# Patient Record
Sex: Female | Born: 1995 | Hispanic: Yes | Marital: Single | Smoking: Never smoker
Health system: Southern US, Community
[De-identification: ages and names within clinical notes are randomized; demographics above are authoritative.]

## PROBLEM LIST (undated history)

## (undated) ENCOUNTER — Inpatient Hospital Stay (HOSPITAL_COMMUNITY): Payer: Self-pay

## (undated) DIAGNOSIS — Z3400 Encounter for supervision of normal first pregnancy, unspecified trimester: Secondary | ICD-10-CM

## (undated) DIAGNOSIS — O020 Blighted ovum and nonhydatidiform mole: Secondary | ICD-10-CM

## (undated) HISTORY — PX: NO PAST SURGERIES: SHX2092

## (undated) HISTORY — DX: Encounter for supervision of normal first pregnancy, unspecified trimester: Z34.00

## (undated) HISTORY — DX: Blighted ovum and nonhydatidiform mole: O02.0

---
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
## 2015-01-10 ENCOUNTER — Inpatient Hospital Stay (HOSPITAL_COMMUNITY)
Admission: AD | Admit: 2015-01-10 | Discharge: 2015-01-10 | Disposition: A | Discharge status: Home / Self Care | Payer: Medicaid Other | Source: Ambulatory Visit | Attending: Obstetrics and Gynecology | Admitting: Obstetrics and Gynecology

## 2015-01-10 ENCOUNTER — Inpatient Hospital Stay (HOSPITAL_COMMUNITY): Payer: Medicaid Other

## 2015-01-10 DIAGNOSIS — Z3A08 8 weeks gestation of pregnancy: Secondary | ICD-10-CM | POA: Insufficient documentation

## 2015-01-10 DIAGNOSIS — O208 Other hemorrhage in early pregnancy: Secondary | ICD-10-CM | POA: Insufficient documentation

## 2015-01-10 DIAGNOSIS — O9989 Other specified diseases and conditions complicating pregnancy, childbirth and the puerperium: Secondary | ICD-10-CM | POA: Diagnosis present

## 2015-01-10 DIAGNOSIS — Z09 Encounter for follow-up examination after completed treatment for conditions other than malignant neoplasm: Secondary | ICD-10-CM

## 2015-01-10 NOTE — MAU Provider Note (Signed)
Subjective:  Sheila Sosa is a 19 y.o. female G3P1011 at 6468w0d who presents for a follow up appointment. She was seen here in MAU on 5/24 with lower abdominal pain; US showed a probable IUP and probable yolk sac. 48 hour quant did not rise appropriately and the patient was instructed to return for an additional quant, which the patient failed to return for. Currently she denies pain or vaginal bleeding.     Objective:  GENERAL: Well-developed, well-nourished female in no acute distress.  LUNGS: Effort normal SKIN: Warm, dry and without erythema PSYCH: Normal mood and affect  Filed Vitals:   01/10/15 0945  BP: 122/59  Pulse: 78  Temp: 98.2 F (36.8 C)  Resp: 18    [place] Ob Transvaginal  01/10/2015   CLINICAL DATA:  Assess viability  EXAM: TRANSVAGINAL OB ULTRASOUND  TECHNIQUE: Transvaginal ultrasound was performed for complete evaluation of the gestation as well as the maternal uterus, adnexal regions, and pelvic cul-de-sac.  COMPARISON:  None.  FINDINGS: Intrauterine gestational sac: Single  Yolk sac:  Yes  Embryo:  Yes  Cardiac Activity: Yes  Heart Rate: 144 bpm  CRL:   16.8  mm   8 w 1 d                  US EDC: 08/21/2015  Maternal uterus/adnexae:  Subchorionic hemorrhage: Moderate subchorionic hemorrhage measuring 2.4 x 1.2 x 2.6 cm noted.  Right ovary: Not visualize  Left ovary: Normal  Other :None  Free fluid:  None  IMPRESSION: 1. Single living intrauterine gestation. The estimated gestational age is 8 weeks and 1 day. The clinical gestational age is 7 weeks and 0 days 2. Moderate subchorionic hemorrhage.   Electronically Signed   By: Signa Kellaylor  Stroud M.D.   On: 01/10/2015 10:26    Assesment:  SIUP @ 3363w1d Moderate subchorionic hemorrhage.    Plan:  Discharge home in stable condition Start prenatal care ASAP First trimester warning signs discussed Return to MAU as needed, if symptoms worsen.     Duane LopeJennifer I Rogerio Boutelle, NP 01/10/2015 10:42 AM

## 2015-03-27 ENCOUNTER — Inpatient Hospital Stay (HOSPITAL_COMMUNITY)
Admission: AD | Admit: 2015-03-27 | Discharge: 2015-03-27 | Discharge status: Against Medical Advice | Payer: Medicaid Other | Source: Ambulatory Visit | Attending: Obstetrics & Gynecology | Admitting: Obstetrics & Gynecology

## 2015-03-27 DIAGNOSIS — Z5320 Procedure and treatment not carried out because of patient's decision for unspecified reasons: Secondary | ICD-10-CM | POA: Insufficient documentation

## 2015-03-27 DIAGNOSIS — R109 Unspecified abdominal pain: Secondary | ICD-10-CM | POA: Diagnosis present

## 2015-03-27 LAB — URINE MICROSCOPIC-ADD ON

## 2015-03-27 LAB — URINALYSIS, ROUTINE W REFLEX MICROSCOPIC
GLUCOSE, UA: NEGATIVE mg/dL
HGB URINE DIPSTICK: NEGATIVE
KETONES UR: NEGATIVE mg/dL
Nitrite: NEGATIVE
PROTEIN: NEGATIVE mg/dL
Specific Gravity, Urine: 1.025 (ref 1.005–1.030)
UROBILINOGEN UA: 2 mg/dL — AB (ref 0.0–1.0)
pH: 6.5 (ref 5.0–8.0)

## 2015-03-27 NOTE — MAU Note (Signed)
Pt left, did not inform staff

## 2015-03-27 NOTE — MAU Note (Signed)
Stomach pain since beginning of pregnancy. Has continued and now hurts down into vagina. Started having white D/C. Went to one prenatal appointment in [place] and now lives in [place] again. Thinks she will go to the clinic here.

## 2015-04-11 ENCOUNTER — Ambulatory Visit (INDEPENDENT_AMBULATORY_CARE_PROVIDER_SITE_OTHER): Payer: Self-pay | Admitting: Obstetrics and Gynecology

## 2015-04-11 ENCOUNTER — Encounter: Payer: Self-pay | Admitting: Obstetrics and Gynecology

## 2015-04-11 VITALS — BP 119/64 | HR 86 | Temp 98.6°F | Wt 197.9 lb

## 2015-04-11 DIAGNOSIS — Z118 Encounter for screening for other infectious and parasitic diseases: Secondary | ICD-10-CM

## 2015-04-11 DIAGNOSIS — Z124 Encounter for screening for malignant neoplasm of cervix: Secondary | ICD-10-CM

## 2015-04-11 DIAGNOSIS — O418X90 Other specified disorders of amniotic fluid and membranes, unspecified trimester, not applicable or unspecified: Secondary | ICD-10-CM | POA: Insufficient documentation

## 2015-04-11 DIAGNOSIS — Z3490 Encounter for supervision of normal pregnancy, unspecified, unspecified trimester: Secondary | ICD-10-CM | POA: Insufficient documentation

## 2015-04-11 DIAGNOSIS — Z113 Encounter for screening for infections with a predominantly sexual mode of transmission: Secondary | ICD-10-CM

## 2015-04-11 DIAGNOSIS — Z23 Encounter for immunization: Secondary | ICD-10-CM

## 2015-04-11 DIAGNOSIS — Z3492 Encounter for supervision of normal pregnancy, unspecified, second trimester: Secondary | ICD-10-CM

## 2015-04-11 DIAGNOSIS — O0932 Supervision of pregnancy with insufficient antenatal care, second trimester: Secondary | ICD-10-CM

## 2015-04-11 DIAGNOSIS — O468X9 Other antepartum hemorrhage, unspecified trimester: Principal | ICD-10-CM

## 2015-04-11 LAB — POCT URINALYSIS DIP (DEVICE)
Glucose, UA: 100 mg/dL — AB
Nitrite: NEGATIVE
PH: 6.5 (ref 5.0–8.0)
Protein, ur: NEGATIVE mg/dL
SPECIFIC GRAVITY, URINE: 1.025 (ref 1.005–1.030)
Urobilinogen, UA: 1 mg/dL (ref 0.0–1.0)

## 2015-04-11 LAB — GLUCOSE TOLERANCE, 1 HOUR (50G) W/O FASTING: GLUCOSE 1 HOUR GTT: 82 mg/dL (ref 70–140)

## 2015-04-11 NOTE — Progress Notes (Signed)
Subjective:  RHONDA LINAN is a 19 y.o. G3P1011 at [redacted]w[redacted]d being seen today for initial prenatal care.  Patient reports no complaints.  Contractions: Irritability.  Vag. Bleeding: None. Movement: Present. Denies leaking of fluid.   The following portions of the patient's history were reviewed and updated as appropriate: allergies, current medications, past family history, past medical history, past social history, past surgical history and problem list.   Objective:   Filed Vitals:   04/11/15 0840  BP: 119/64  Pulse: 86  Temp: 98.6 F (37 C)  Weight: 197 lb 14.4 oz (89.767 kg)    Fetal Status: Fetal Heart Rate (bpm): 150 Fundal Height: 19 cm Movement: Present     General:  Alert, oriented and cooperative. Patient is in no acute distress.  Skin: Skin is warm and dry. No rash noted.   Cardiovascular: Normal heart rate noted  Respiratory: Normal respiratory effort, no problems with respiration noted  Abdomen: Soft, gravid, appropriate for gestational age. Pain/Pressure: Absent     Pelvic: Vag. Bleeding: None     Cervical exam deferred        Extremities: Normal range of motion.  Edema: None  Mental Status: Normal mood and affect. Normal behavior. Normal judgment and thought content.   Urinalysis: Urine Protein: Negative Urine Glucose: 1+  Assessment and Plan:  Pregnancy: G3P1011 at [redacted]w[redacted]d  1. Subchorionic hemorrhage, unspecified trimester - expectant mgmt  2. Supervision of low-risk pregnancy, unspecified trimester - continue PNV - anatomy ultrasound, scheduling - prenatal panel, urine culture, vaginal gc/chlamydia, UDS - 1-hour GTT (fam hx DM) - declines quad  4. Flu vaccine need - Flu Vaccine QUAD 36+ mos IM; Standing - Flu Vaccine QUAD 36+ mos IM  Preterm labor symptoms and general obstetric precautions including but not limited to vaginal bleeding, contractions, leaking of fluid and fetal movement were reviewed in detail with the patient. Please refer to After  Visit Summary for other counseling recommendations.  Return in about 4 weeks (around 05/09/2015).   Kathrynn Running, MD

## 2015-04-11 NOTE — Progress Notes (Signed)
Initial prenatal visits Flu vaccine 1 hour gtt New OB educational packet given Breastfeeding tip of the week reviewed Home Medicaid form completed Bilirubin:small, Ketones:trace,hgb: trace, leukocytes: small

## 2015-04-12 LAB — GC/CHLAMYDIA PROBE AMP (~~LOC~~) NOT AT ARMC
Chlamydia: NEGATIVE
Neisseria Gonorrhea: NEGATIVE

## 2015-04-13 LAB — PRENATAL PROFILE (SOLSTAS)
Antibody Screen: NEGATIVE
Basophils Absolute: 0 10*3/uL (ref 0.0–0.1)
Basophils Relative: 0 % (ref 0–1)
Eosinophils Absolute: 0.1 10*3/uL (ref 0.0–0.7)
Eosinophils Relative: 2 % (ref 0–5)
HCT: 33.2 % — ABNORMAL LOW (ref 36.0–46.0)
HEP B S AG: NEGATIVE
HIV: NONREACTIVE
Hemoglobin: 11.2 g/dL — ABNORMAL LOW (ref 12.0–15.0)
LYMPHS ABS: 1.5 10*3/uL (ref 0.7–4.0)
LYMPHS PCT: 22 % (ref 12–46)
MCH: 31.3 pg (ref 26.0–34.0)
MCHC: 33.7 g/dL (ref 30.0–36.0)
MCV: 92.7 fL (ref 78.0–100.0)
MONO ABS: 0.4 10*3/uL (ref 0.1–1.0)
MPV: 9.5 fL (ref 8.6–12.4)
Monocytes Relative: 6 % (ref 3–12)
Neutro Abs: 4.6 10*3/uL (ref 1.7–7.7)
Neutrophils Relative %: 70 % (ref 43–77)
Platelets: 248 10*3/uL (ref 150–400)
RBC: 3.58 MIL/uL — ABNORMAL LOW (ref 3.87–5.11)
RDW: 14.2 % (ref 11.5–15.5)
Rh Type: POSITIVE
Rubella: 2.42 Index — ABNORMAL HIGH (ref <0.90)
WBC: 6.6 10*3/uL (ref 4.0–10.5)

## 2015-04-13 LAB — PRESCRIPTION MONITORING PROFILE (19 PANEL)
AMPHETAMINE/METH: NEGATIVE ng/mL
BARBITURATE SCREEN, URINE: NEGATIVE ng/mL
Benzodiazepine Screen, Urine: NEGATIVE ng/mL
Buprenorphine, Urine: NEGATIVE ng/mL
CANNABINOID SCRN UR: NEGATIVE ng/mL
CARISOPRODOL, URINE: NEGATIVE ng/mL
Cocaine Metabolites: NEGATIVE ng/mL
Creatinine, Urine: 296.63 mg/dL (ref >20.0)
Fentanyl, Ur: NEGATIVE ng/mL
MDMA URINE: NEGATIVE ng/mL
MEPERIDINE UR: NEGATIVE ng/mL
Methadone Screen, Urine: NEGATIVE ng/mL
Methaqualone: NEGATIVE ng/mL
NITRITES URINE, INITIAL: NEGATIVE ug/mL
OPIATE SCREEN, URINE: NEGATIVE ng/mL
OXYCODONE SCRN UR: NEGATIVE ng/mL
PHENCYCLIDINE, UR: NEGATIVE ng/mL
PROPOXYPHENE: NEGATIVE ng/mL
TAPENTADOLUR: NEGATIVE ng/mL
Tramadol Scrn, Ur: NEGATIVE ng/mL
Zolpidem, Urine: NEGATIVE ng/mL
pH, Initial: 5.8 pH (ref 4.5–8.9)

## 2015-04-13 LAB — CULTURE, OB URINE

## 2015-04-13 LAB — HEMOGLOBINOPATHY EVALUATION
HGB A2 QUANT: 2.7 % (ref 2.2–3.2)
Hemoglobin Other: 0 %
Hgb A: 96.5 % — ABNORMAL LOW (ref 96.8–97.8)
Hgb F Quant: 0.8 % (ref 0.0–2.0)
Hgb S Quant: 0 %

## 2015-04-17 ENCOUNTER — Ambulatory Visit (HOSPITAL_COMMUNITY)
Admission: RE | Admit: 2015-04-17 | Discharge: 2015-04-17 | Disposition: A | Discharge status: Home / Self Care | Payer: Medicaid Other | Source: Ambulatory Visit | Attending: Obstetrics and Gynecology | Admitting: Obstetrics and Gynecology

## 2015-04-17 DIAGNOSIS — O0932 Supervision of pregnancy with insufficient antenatal care, second trimester: Secondary | ICD-10-CM | POA: Diagnosis present

## 2015-05-09 ENCOUNTER — Ambulatory Visit (INDEPENDENT_AMBULATORY_CARE_PROVIDER_SITE_OTHER): Payer: Self-pay | Admitting: Advanced Practice Midwife

## 2015-05-09 VITALS — BP 105/63 | HR 86 | Temp 98.2°F | Wt 196.0 lb

## 2015-05-09 DIAGNOSIS — O2610 Low weight gain in pregnancy, unspecified trimester: Secondary | ICD-10-CM | POA: Insufficient documentation

## 2015-05-09 DIAGNOSIS — O358XX0 Maternal care for other (suspected) fetal abnormality and damage, not applicable or unspecified: Secondary | ICD-10-CM | POA: Insufficient documentation

## 2015-05-09 DIAGNOSIS — O2612 Low weight gain in pregnancy, second trimester: Secondary | ICD-10-CM

## 2015-05-09 DIAGNOSIS — O35EXX0 Maternal care for other (suspected) fetal abnormality and damage, fetal genitourinary anomalies, not applicable or unspecified: Secondary | ICD-10-CM

## 2015-05-09 DIAGNOSIS — Z3492 Encounter for supervision of normal pregnancy, unspecified, second trimester: Secondary | ICD-10-CM

## 2015-05-09 LAB — POCT URINALYSIS DIP (DEVICE)
Glucose, UA: NEGATIVE mg/dL
NITRITE: NEGATIVE
PH: 6 (ref 5.0–8.0)
PROTEIN: 30 mg/dL — AB
Specific Gravity, Urine: >=1.03 (ref 1.005–1.030)
Urobilinogen, UA: 1 mg/dL (ref 0.0–1.0)

## 2015-05-09 NOTE — Progress Notes (Signed)
Subjective:  Sheila Sosa is a 19 y.o. G3P1011 at [redacted]w[redacted]d being seen today for ongoing prenatal care.  Patient reports no complaints. No N/V, but less appetite throughout pregnancy.  Contractions: Not present.  Vag. Bleeding: None. Movement: Present. Denies leaking of fluid.   Anatomy scan showed fetal pyelectasis. EFW 48%.  The following portions of the patient's history were reviewed and updated as appropriate: allergies, current medications, past family history, past medical history, past social history, past surgical history and problem list.   Objective:   Filed Vitals:   05/09/15 1052  BP: 105/63  Pulse: 86  Temp: 98.2 F (36.8 C)  Weight: 196 lb (88.905 kg)    Fetal Status: Fetal Heart Rate (bpm): 140   Movement: Present     General:  Alert, oriented and cooperative. Patient is in no acute distress.  Skin: Skin is warm and dry. No rash noted.   Cardiovascular: Normal heart rate noted  Respiratory: Normal respiratory effort, no problems with respiration noted  Abdomen: Soft, gravid, appropriate for gestational age. Pain/Pressure: Absent     Pelvic: Vag. Bleeding: None     Cervical exam deferred        Extremities: Normal range of motion.  Edema: Trace  Mental Status: Normal mood and affect. Normal behavior. Normal judgment and thought content.   Urinalysis: Urine Protein: 1+ Urine Glucose: Negative  Assessment and Plan:  Pregnancy: G3P1011 at [redacted]w[redacted]d  1. Supervision of low-risk pregnancy, second trimester  - [place] MFM OB FOLLOW UP; Future  2. Poor weight gain of pregnancy, second trimester  - [place] MFM OB FOLLOW UP; Future  3. Ultrasound recheck of fetal pyelectasis, antepartum, not applicable or unspecified fetus  - [place] MFM OB FOLLOW UP; Future  Preterm labor symptoms and general obstetric precautions including but not limited to vaginal bleeding, contractions, leaking of fluid and fetal movement were reviewed in detail with the patient. Please refer to After Visit  Summary for other counseling recommendations.  Discussed having small, frequent meals.  Return in about 4 weeks (around 06/06/2015) for ROB/GTT.   [name], CNM

## 2015-05-09 NOTE — Patient Instructions (Signed)
Glucose Tolerance Test During Pregnancy The glucose tolerance test (GTT) is a blood test used to determine if you have developed a type of diabetes during pregnancy (gestational diabetes). This is when your body does not properly process sugar (glucose) in the food you eat, resulting in high blood glucose levels. Typically, a GTT is done after you have had a 1-hour glucose test with results that indicate you possibly have gestational diabetes. It may also be done if:  You have a history of giving birth to very large babies or have experienced repeated fetal loss (stillbirth).   You have signs and symptoms of diabetes, such as:   Changes in your vision.   Tingling or numbness in your hands or feet.   Changes in hunger, thirst, and urination not otherwise explained by your pregnancy.  The GTT lasts about 3 hours. You will be given a sugar-water solution to drink at the beginning of the test. You will have blood drawn before you drink the solution and then again 1, 2, and 3 hours after you drink it. You will not be allowed to eat or drink anything else during the test. You must remain at the testing location to make sure that your blood is drawn on time. You should also avoid exercising during the test, because exercise can alter test results. PREPARATION FOR TEST  Eat normally for 3 days prior to the GTT test, including having plenty of carbohydrate-rich foods. Do not eat or drink anything except water during the final 12 hours before the test. In addition, your health care provider may ask you to stop taking certain medicines before the test. RESULTS  It is your responsibility to obtain your test results. Ask the lab or department performing the test when and how you will get your results. Contact your health care provider to discuss any questions you have about your results.  Range of Normal Values Ranges for normal values may vary among different labs and hospitals. You should always check  with your health care provider after having lab work or other tests done to discuss whether your values are considered within normal limits. Normal levels of blood glucose are as follows:  Fasting: less than 105 mg/dL.   1 hour after drinking the solution: less than 190 mg/dL.   2 hours after drinking the solution: less than 165 mg/dL.   3 hours after drinking the solution: less than 145 mg/dL.  Some substances can interfere with GTT results. These may include:  Blood pressure and heart failure medicines, including beta blockers, furosemide, and thiazides.   Anti-inflammatory medicines, including aspirin.   Nicotine.   Some psychiatric medicines.  Meaning of Results Outside Normal Value Ranges GTT test results that are above normal values may indicate a number of health problems, such as:   Gestational diabetes.   Acute stress response.   Cushing syndrome.   Tumors such as pheochromocytoma or glucagonoma.   Long-term kidney problems.   Pancreatitis.   Hyperthyroidism.   Current infection.  Discuss your test results with your health care provider. He or she will use the results to make a diagnosis and determine a treatment plan that is right for you.   This information is not intended to replace advice given to you by your health care provider. Make sure you discuss any questions you have with your health care provider.   Document Released: 01/20/2012 Document Revised: 08/11/2014 Document Reviewed: 11/25/2013 Elsevier Interactive Patient Education 2016 [employer].   Tdap Vaccine (Tetanus,  Diphtheria and Pertussis): What You Need to Know 1. Why get vaccinated? Tetanus, diphtheria and pertussis are very serious diseases. Tdap vaccine can protect [place] from these diseases. And, Tdap vaccine given to pregnant women can protect newborn babies against pertussis. TETANUS (Lockjaw) is rare in the [place] today. It causes painful muscle tightening and  stiffness, usually all over the body.  It can lead to tightening of muscles in the head and neck so you can't open your mouth, swallow, or sometimes even breathe. Tetanus kills about 1 out of 10 people who are infected even after receiving the best medical care. DIPHTHERIA is also rare in the [place] today. It can cause a thick coating to form in the back of the throat.  It can lead to breathing problems, heart failure, paralysis, and death. PERTUSSIS (Whooping Cough) causes severe coughing spells, which can cause difficulty breathing, vomiting and disturbed sleep.  It can also lead to weight loss, incontinence, and rib fractures. Up to 2 in 100 adolescents and 5 in 100 adults with pertussis are hospitalized or have complications, which could include pneumonia or death. These diseases are caused by bacteria. Diphtheria and pertussis are spread from person to person through secretions from coughing or sneezing. Tetanus enters the body through cuts, scratches, or wounds. Before vaccines, as many as 200,000 cases of diphtheria, 200,000 cases of pertussis, and hundreds of cases of tetanus, were reported in the [place] each year. Since vaccination began, reports of cases for tetanus and diphtheria have dropped by about 99% and for pertussis by about 80%. 2. Tdap vaccine Tdap vaccine can protect adolescents and adults from tetanus, diphtheria, and pertussis. One dose of Tdap is routinely given at age 92 or 48. People who did not get Tdap at that age should get it as soon as possible. Tdap is especially important for healthcare professionals and anyone having close contact with a baby younger than 12 months. Pregnant women should get a dose of Tdap during every pregnancy, to protect the newborn from pertussis. Infants are most at risk for severe, life-threatening complications from pertussis. Another vaccine, called Td, protects against tetanus and diphtheria, but not pertussis. A Td booster  should be given every 10 years. Tdap may be given as one of these boosters if you have never gotten Tdap before. Tdap may also be given after a severe cut or burn to prevent tetanus infection. Your doctor or the person giving you the vaccine can give you more information. Tdap may safely be given at the same time as other vaccines. 3. Some people should not get this vaccine  A person who has ever had a life-threatening allergic reaction after a previous dose of any diphtheria, tetanus or pertussis containing vaccine, OR has a severe allergy to any part of this vaccine, should not get Tdap vaccine. Tell the person giving the vaccine about any severe allergies.  Anyone who had coma or long repeated seizures within 7 days after a childhood dose of DTP or DTaP, or a previous dose of Tdap, should not get Tdap, unless a cause other than the vaccine was found. They can still get Td.  Talk to your doctor if you:  have seizures or another nervous system problem,  had severe pain or swelling after any vaccine containing diphtheria, tetanus or pertussis,  ever had a condition called Guillain-Barr Syndrome (GBS),  aren't feeling well on the day the shot is scheduled. 4. Risks With any medicine, including vaccines, there is a chance  of side effects. These are usually mild and go away on their own. Serious reactions are also possible but are rare. Most people who get Tdap vaccine do not have any problems with it. Mild problems following Tdap (Did not interfere with activities)  Pain where the shot was given (about 3 in 4 adolescents or 2 in 3 adults)  Redness or swelling where the shot was given (about 1 person in 5)  Mild fever of at least 100.64F (up to about 1 in 25 adolescents or 1 in 100 adults)  Headache (about 3 or 4 people in 10)  Tiredness (about 1 person in 3 or 4)  Nausea, vomiting, diarrhea, stomach ache (up to 1 in 4 adolescents or 1 in 10 adults)  Chills, sore joints (about 1  person in 10)  Body aches (about 1 person in 3 or 4)  Rash, swollen glands (uncommon) Moderate problems following Tdap (Interfered with activities, but did not require medical attention)  Pain where the shot was given (up to 1 in 5 or 6)  Redness or swelling where the shot was given (up to about 1 in 16 adolescents or 1 in 12 adults)  Fever over 102F (about 1 in 100 adolescents or 1 in 250 adults)  Headache (about 1 in 7 adolescents or 1 in 10 adults)  Nausea, vomiting, diarrhea, stomach ache (up to 1 or 3 people in 100)  Swelling of the entire arm where the shot was given (up to about 1 in 500). Severe problems following Tdap (Unable to perform usual activities; required medical attention)  Swelling, severe pain, bleeding and redness in the arm where the shot was given (rare). Problems that could happen after any vaccine:  People sometimes faint after a medical procedure, including vaccination. Sitting or lying down for about 15 minutes can help prevent fainting, and injuries caused by a fall. Tell your doctor if you feel dizzy, or have vision changes or ringing in the ears.  Some people get severe pain in the shoulder and have difficulty moving the arm where a shot was given. This happens very rarely.  Any medication can cause a severe allergic reaction. Such reactions from a vaccine are very rare, estimated at fewer than 1 in a million doses, and would happen within a few minutes to a few hours after the vaccination. As with any medicine, there is a very remote chance of a vaccine causing a serious injury or death. The safety of vaccines is always being monitored. For more information, visit: http://floyd.org/www.cdc.gov/vaccinesafety/ 5. What if there is a serious problem? What should I look for?  Look for anything that concerns you, such as signs of a severe allergic reaction, very high fever, or unusual behavior.  Signs of a severe allergic reaction can include hives, swelling of the face  and throat, difficulty breathing, a fast heartbeat, dizziness, and weakness. These would usually start a few minutes to a few hours after the vaccination. What should I do?  If you think it is a severe allergic reaction or other emergency that can't wait, call 9-1-1 or get the person to the nearest hospital. Otherwise, call your doctor.  Afterward, the reaction should be reported to the Vaccine Adverse Event Reporting System (VAERS). Your doctor might file this report, or you can do it yourself through the VAERS web site at www.vaers.LAgents.nohhs.gov, or by calling 1-540-473-5225. VAERS does not give medical advice.  6. The National Vaccine Injury Compensation Program The [employer] Vaccine Injury Compensation Program (VICP) is  a federal program that was created to compensate people who may have been injured by certain vaccines. Persons who believe they may have been injured by a vaccine can learn about the program and about filing a claim by calling 1-(469)037-2253 or visiting the VICP website at SpiritualWord.at. There is a time limit to file a claim for compensation. 7. How can I learn more?  Ask your doctor. He or she can give you the vaccine package insert or suggest other sources of information.  Call your local or state health department.  Contact the Centers for Disease Control and Prevention (CDC):  Call (772)049-1794 (1-800-CDC-INFO) or  Visit CDC's website at PicCapture.uy CDC Tdap Vaccine VIS (09/27/13)   This information is not intended to replace advice given to you by your health care provider. Make sure you discuss any questions you have with your health care provider.   Document Released: 01/20/2012 Document Revised: 08/11/2014 Document Reviewed: 11/02/2013 Elsevier Interactive Patient Education [employer]

## 2015-05-30 ENCOUNTER — Encounter: Payer: Self-pay | Admitting: Certified Nurse Midwife

## 2015-05-30 ENCOUNTER — Ambulatory Visit (INDEPENDENT_AMBULATORY_CARE_PROVIDER_SITE_OTHER): Payer: Self-pay | Admitting: Obstetrics & Gynecology

## 2015-05-30 ENCOUNTER — Encounter: Payer: Self-pay | Admitting: Advanced Practice Midwife

## 2015-05-30 VITALS — BP 115/64 | HR 94 | Temp 98.4°F | Wt 196.6 lb

## 2015-05-30 DIAGNOSIS — Z23 Encounter for immunization: Secondary | ICD-10-CM

## 2015-05-30 DIAGNOSIS — O2612 Low weight gain in pregnancy, second trimester: Secondary | ICD-10-CM

## 2015-05-30 DIAGNOSIS — Z3492 Encounter for supervision of normal pregnancy, unspecified, second trimester: Secondary | ICD-10-CM

## 2015-05-30 LAB — CBC
HCT: 33.9 % — ABNORMAL LOW (ref 36.0–46.0)
Hemoglobin: 11.2 g/dL — ABNORMAL LOW (ref 12.0–15.0)
MCH: 29.4 pg (ref 26.0–34.0)
MCHC: 33 g/dL (ref 30.0–36.0)
MCV: 89 fL (ref 78.0–100.0)
MPV: 9.6 fL (ref 8.6–12.4)
Platelets: 253 10*3/uL (ref 150–400)
RBC: 3.81 MIL/uL — AB (ref 3.87–5.11)
RDW: 13.4 % (ref 11.5–15.5)
WBC: 8.4 10*3/uL (ref 4.0–10.5)

## 2015-05-30 LAB — POCT URINALYSIS DIP (DEVICE)
Glucose, UA: NEGATIVE mg/dL
Hgb urine dipstick: NEGATIVE
Nitrite: NEGATIVE
Protein, ur: 30 mg/dL — AB
Specific Gravity, Urine: 1.02 (ref 1.005–1.030)
Urobilinogen, UA: 4 mg/dL — ABNORMAL HIGH (ref 0.0–1.0)
pH: 7 (ref 5.0–8.0)

## 2015-05-30 MED ORDER — TETANUS-DIPHTH-ACELL PERTUSSIS 5-2.5-18.5 LF-MCG/0.5 IM SUSP
0.5000 mL | Freq: Once | INTRAMUSCULAR | Status: AC
  Administered 2015-05-30: 0.5 mL via INTRAMUSCULAR

## 2015-05-30 NOTE — Progress Notes (Signed)
Subjective: sometime weak dizzy  Sheila Sosa is a 19 y.o. G3P1011 at 4662w0d being seen today for ongoing prenatal care.  Patient reports fatigue.  Contractions: Not present.  Vag. Bleeding: None. Movement: Present. Denies leaking of fluid.   The following portions of the patient's history were reviewed and updated as appropriate: allergies, current medications, past family history, past medical history, past social history, past surgical history and problem list. Problem list updated.  Objective:   Filed Vitals:   05/30/15 1442  BP: 115/64  Pulse: 94  Temp: 98.4 F (36.9 C)  Weight: 196 lb 9.6 oz (89.177 kg)    Fetal Status: Fetal Heart Rate (bpm): 160   Movement: Present     General:  Alert, oriented and cooperative. Patient is in no acute distress.  Skin: Skin is warm and dry. No rash noted.   Cardiovascular: Normal heart rate noted  Respiratory: Normal respiratory effort, no problems with respiration noted  Abdomen: Soft, gravid, appropriate for gestational age. Pain/Pressure: Absent     Pelvic: Vag. Bleeding: None     Cervical exam deferred        Extremities: Normal range of motion.  Edema: Trace  Mental Status: Normal mood and affect. Normal behavior. Normal judgment and thought content.   Urinalysis: Urine Protein: 1+ Urine Glucose: Negative  Assessment and Plan:  Pregnancy: G3P1011 at 1562w0d  1. Supervision of low-risk pregnancy, second trimester Routine testing - POCT urinalysis dip (device) - Glucose Tolerance, 1 Hour - CBC - RPR - HIV antibody (with reflex) - Tdap (BOOSTRIX) injection 0.5 mL; Inject 0.5 mLs into the muscle once.  2. Poor weight gain of pregnancy, second trimester Weight loss See nutritionist Preterm labor symptoms and general obstetric precautions including but not limited to vaginal bleeding, contractions, leaking of fluid and fetal movement were reviewed in detail with the patient. Please refer to After Visit Summary for other  counseling recommendations.  Return in about 2 weeks (around 06/13/2015).   Adam PhenixJames G Alysandra Lobue, MD

## 2015-05-31 ENCOUNTER — Encounter: Payer: Self-pay | Admitting: Obstetrics & Gynecology

## 2015-05-31 LAB — HIV ANTIBODY (ROUTINE TESTING W REFLEX): HIV 1&2 Ab, 4th Generation: NONREACTIVE

## 2015-06-01 LAB — GLUCOSE TOLERANCE, 1 HOUR: Glucose, 1 hour: 118 mg/dL (ref 70–170)

## 2015-06-02 LAB — RPR

## 2015-06-13 ENCOUNTER — Ambulatory Visit (HOSPITAL_COMMUNITY): Payer: Self-pay

## 2015-06-13 ENCOUNTER — Ambulatory Visit (HOSPITAL_COMMUNITY)
Admission: RE | Admit: 2015-06-13 | Discharge: 2015-06-13 | Disposition: A | Discharge status: Home / Self Care | Payer: Medicaid Other | Source: Ambulatory Visit | Attending: Advanced Practice Midwife | Admitting: Advanced Practice Midwife

## 2015-06-13 ENCOUNTER — Other Ambulatory Visit: Payer: Self-pay | Admitting: Advanced Practice Midwife

## 2015-06-13 DIAGNOSIS — Z3A30 30 weeks gestation of pregnancy: Secondary | ICD-10-CM

## 2015-06-13 DIAGNOSIS — O2613 Low weight gain in pregnancy, third trimester: Secondary | ICD-10-CM | POA: Diagnosis not present

## 2015-06-13 DIAGNOSIS — O2612 Low weight gain in pregnancy, second trimester: Secondary | ICD-10-CM

## 2015-06-13 DIAGNOSIS — O359XX0 Maternal care for (suspected) fetal abnormality and damage, unspecified, not applicable or unspecified: Secondary | ICD-10-CM

## 2015-06-13 DIAGNOSIS — O358XX0 Maternal care for other (suspected) fetal abnormality and damage, not applicable or unspecified: Secondary | ICD-10-CM | POA: Insufficient documentation

## 2015-06-13 DIAGNOSIS — Z3492 Encounter for supervision of normal pregnancy, unspecified, second trimester: Secondary | ICD-10-CM

## 2015-06-13 DIAGNOSIS — O35EXX0 Maternal care for other (suspected) fetal abnormality and damage, fetal genitourinary anomalies, not applicable or unspecified: Secondary | ICD-10-CM

## 2015-06-14 ENCOUNTER — Encounter: Payer: Self-pay | Admitting: Advanced Practice Midwife

## 2015-06-14 ENCOUNTER — Ambulatory Visit (INDEPENDENT_AMBULATORY_CARE_PROVIDER_SITE_OTHER): Payer: Medicaid Other | Admitting: Advanced Practice Midwife

## 2015-06-14 VITALS — BP 107/65 | HR 89 | Temp 98.8°F | Wt 197.1 lb

## 2015-06-14 DIAGNOSIS — R42 Dizziness and giddiness: Secondary | ICD-10-CM

## 2015-06-14 DIAGNOSIS — Z3483 Encounter for supervision of other normal pregnancy, third trimester: Secondary | ICD-10-CM

## 2015-06-14 LAB — POCT URINALYSIS DIP (DEVICE)
Bilirubin Urine: NEGATIVE
GLUCOSE, UA: NEGATIVE mg/dL
KETONES UR: NEGATIVE mg/dL
Nitrite: NEGATIVE
Protein, ur: NEGATIVE mg/dL
Specific Gravity, Urine: 1.025 (ref 1.005–1.030)
Urobilinogen, UA: 1 mg/dL (ref 0.0–1.0)
pH: 7 (ref 5.0–8.0)

## 2015-06-14 NOTE — Patient Instructions (Signed)

## 2015-06-14 NOTE — Progress Notes (Signed)
Subjective:  Sheila Sosa is a 19 y.o. G3P1011 at 143w1d being seen today for ongoing prenatal care.  Patient reports fatigue and Some lightheadedness, daily .   States most days recently she feels "sick" and lightheaded. States skin is "yellow, pale with dark circles".  Admits to not drinking enough or eating frequently   Discussed maintaining clear urine/hydration, as well as Protein/CHO at least every 2-3 hrs and adequate sleep. Discussed screen time within 2 hrs of bed should be reduced or use blue filter.   Contractions: Not present.  Vag. Bleeding: None. Movement: Present. Denies leaking of fluid.   The following portions of the patient's history were reviewed and updated as appropriate: allergies, current medications, past family history, past medical history, past social history, past surgical history and problem list. Problem list updated.  Objective:   Filed Vitals:   06/14/15 1113  BP: 107/65  Pulse: 89  Temp: 98.8 F (37.1 C)  Weight: 197 lb 1.6 oz (89.404 kg)    Fetal Status: Fetal Heart Rate (bpm): 140   Movement: Present     General:  Alert, oriented and cooperative. Patient is in no acute distress.  Skin: Skin is warm and dry. No rash noted.   Cardiovascular: Normal heart rate noted  Respiratory: Normal respiratory effort, no problems with respiration noted  Abdomen: Soft, gravid, appropriate for gestational age. Pain/Pressure: Absent     Pelvic: Vag. Bleeding: None     Cervical exam deferred        Extremities: Normal range of motion.  Edema: None  Mental Status: Normal mood and affect. Normal behavior. Normal judgment and thought content.   Urinalysis:      Assessment and Plan:  Pregnancy: G3P1011 at 5243w1d  There are no diagnoses linked to this encounter. Preterm labor symptoms and general obstetric precautions including but not limited to vaginal bleeding, contractions, leaking of fluid and fetal movement were reviewed in detail with the patient.  See  above for interventions discussed regarding fatigue and lightheadedness.  Please refer to After Visit Summary for other counseling recommendations.  Return in about 2 weeks (around 06/28/2015) for [facility] Risk [facility].   Aviva SignsMarie L Barnard Sharps, CNM

## 2015-06-26 ENCOUNTER — Ambulatory Visit (INDEPENDENT_AMBULATORY_CARE_PROVIDER_SITE_OTHER): Payer: Medicaid Other | Admitting: Advanced Practice Midwife

## 2015-06-26 VITALS — BP 116/61 | HR 86 | Temp 98.3°F | Wt 197.3 lb

## 2015-06-26 DIAGNOSIS — Z3493 Encounter for supervision of normal pregnancy, unspecified, third trimester: Secondary | ICD-10-CM

## 2015-06-26 LAB — POCT URINALYSIS DIP (DEVICE)
GLUCOSE, UA: NEGATIVE mg/dL
Hgb urine dipstick: NEGATIVE
NITRITE: NEGATIVE
Protein, ur: 30 mg/dL — AB
Specific Gravity, Urine: >=1.03 (ref 1.005–1.030)
pH: 6 (ref 5.0–8.0)

## 2015-06-26 MED ORDER — CVS PRENATAL GUMMY 0.4-113.5 MG PO CHEW: 2.0000 | CHEWABLE_TABLET | Freq: Every day | ORAL | Status: DC

## 2015-06-26 NOTE — Progress Notes (Signed)
Pt request Rx for gummie prenatal vitamins

## 2015-06-26 NOTE — Progress Notes (Signed)
Subjective:  Sheila Sosa is a 19 y.o. G3P1011 at 3574w6d being seen today for ongoing prenatal care.  She is currently monitored for the following issues for this low-risk pregnancy: Patient Active Problem List   Diagnosis Date Noted  . Poor weight gain of pregnancy 05/09/2015  . Ultrasound recheck of fetal pyelectasis, antepartum 05/09/2015  . Subchorionic hemorrhage 04/11/2015  . Supervision of low-risk pregnancy 04/11/2015   Patient reports no complaints.  Contractions: Not present. Vag. Bleeding: None.  Movement: Present. Denies leaking of fluid.   The following portions of the patient's history were reviewed and updated as appropriate: allergies, current medications, past family history, past medical history, past social history, past surgical history and problem list. Problem list updated.  Objective:   Filed Vitals:   06/26/15 1617  BP: 116/61  Pulse: 86  Temp: 98.3 F (36.8 C)  Weight: 197 lb 5 oz (89.5 kg)    Fetal Status: Fetal Heart Rate (bpm): 147   Movement: Present     General:  Alert, oriented and cooperative. Patient is in no acute distress.  Skin: Skin is warm and dry. No rash noted.   Cardiovascular: Normal heart rate noted  Respiratory: Normal respiratory effort, no problems with respiration noted  Abdomen: Soft, gravid, appropriate for gestational age. Pain/Pressure: Absent     Pelvic: Vag. Bleeding: None     Cervical exam deferred        Extremities: Normal range of motion.  Edema: None  Mental Status: Normal mood and affect. Normal behavior. Normal judgment and thought content.   Urinalysis: Urine Protein: 1+ Urine Glucose: Negative  Assessment and Plan:  Pregnancy: G3P1011 at 974w6d  1. Supervision of low-risk pregnancy, third trimester   Preterm labor symptoms and general obstetric precautions including but not limited to vaginal bleeding, contractions, leaking of fluid and fetal movement were reviewed in detail with the patient. Please refer  to After Visit Summary for other counseling recommendations.  Return in about 2 weeks (around 07/10/2015).   Hurshel PartyLisa A Leftwich-Kirby, CNM

## 2015-07-10 ENCOUNTER — Encounter: Payer: Medicaid Other | Admitting: Student

## 2015-07-11 ENCOUNTER — Encounter: Payer: Self-pay | Admitting: Advanced Practice Midwife

## 2015-07-11 ENCOUNTER — Ambulatory Visit (INDEPENDENT_AMBULATORY_CARE_PROVIDER_SITE_OTHER): Payer: Medicaid Other | Admitting: Advanced Practice Midwife

## 2015-07-11 VITALS — BP 108/58 | HR 83 | Wt 195.9 lb

## 2015-07-11 DIAGNOSIS — O2343 Unspecified infection of urinary tract in pregnancy, third trimester: Secondary | ICD-10-CM | POA: Diagnosis present

## 2015-07-11 DIAGNOSIS — O2613 Low weight gain in pregnancy, third trimester: Secondary | ICD-10-CM

## 2015-07-11 DIAGNOSIS — R82998 Other abnormal findings in urine: Secondary | ICD-10-CM

## 2015-07-11 LAB — POCT URINALYSIS DIP (DEVICE)
Bilirubin Urine: NEGATIVE
Glucose, UA: NEGATIVE mg/dL
KETONES UR: 80 mg/dL — AB
Nitrite: NEGATIVE
PH: 7 (ref 5.0–8.0)
Protein, ur: NEGATIVE mg/dL
Specific Gravity, Urine: 1.02 (ref 1.005–1.030)
Urobilinogen, UA: 4 mg/dL — ABNORMAL HIGH (ref 0.0–1.0)

## 2015-07-11 MED ORDER — CVS PRENATAL GUMMY 0.4-113.5 MG PO CHEW: 2.0000 | CHEWABLE_TABLET | Freq: Every day | ORAL | Status: AC

## 2015-07-11 NOTE — Patient Instructions (Signed)

## 2015-07-11 NOTE — Progress Notes (Signed)
Large leukocytes noted on urinalysis. States starting to have nausea and vomiting for the last week again. States had stopped about 4 months.

## 2015-07-11 NOTE — Progress Notes (Signed)
Subjective:  Sheila Sosa is a 19 y.o. G3P1011 at 4662w0d being seen today for ongoing prenatal care.  She is currently monitored for the following issues for this low-risk pregnancy and has Subchorionic hemorrhage; Supervision of low-risk pregnancy; Poor weight gain of pregnancy; and Ultrasound recheck of fetal pyelectasis, antepartum on her problem list.  Patient reports no complaints.  Contractions: Irregular. Vag. Bleeding: None.  Movement: Present. Denies leaking of fluid.   The following portions of the patient's history were reviewed and updated as appropriate: allergies, current medications, past family history, past medical history, past social history, past surgical history and problem list. Problem list updated.  Objective:   Filed Vitals:   07/11/15 1132  BP: 108/58  Pulse: 83  Weight: 195 lb 14.4 oz (88.86 kg)    Fetal Status: Fetal Heart Rate (bpm): 144   Movement: Present     General:  Alert, oriented and cooperative. Patient is in no acute distress.  Skin: Skin is warm and dry. No rash noted.   Cardiovascular: Normal heart rate noted  Respiratory: Normal respiratory effort, no problems with respiration noted  Abdomen: Soft, gravid, appropriate for gestational age. Pain/Pressure: Present     Pelvic: Vag. Bleeding: None     Cervical exam deferred        Extremities: Normal range of motion.  Edema: Trace  Mental Status: Normal mood and affect. Normal behavior. Normal judgment and thought content.   Urinalysis: Urine Protein: Negative Urine Glucose: Negative  Assessment and Plan:  Pregnancy: G3P1011 at 4562w0d  1. Leukocytes in urine      Will send to culture. Has some frequency but no dysuria - Culture, OB Urine  Preterm labor symptoms and general obstetric precautions including but not limited to vaginal bleeding, contractions, leaking of fluid and fetal movement were reviewed in detail with the patient. Please refer to After Visit Summary for other counseling  recommendations.  Return in about 2 weeks (around 07/25/2015) for [facility].   Aviva SignsMarie L Kallan Merrick, CNM

## 2015-07-12 LAB — CULTURE, OB URINE

## 2015-07-17 ENCOUNTER — Telehealth: Payer: Self-pay

## 2015-07-17 NOTE — Telephone Encounter (Signed)
Pt called this am stating she has not felt the baby moved since last night. Spoke with provider in clinic who suggested she go to [facility] to be check out. Pt verbalizes understanding.

## 2015-07-24 ENCOUNTER — Encounter: Payer: Medicaid Other | Admitting: Student

## 2015-07-25 ENCOUNTER — Ambulatory Visit (INDEPENDENT_AMBULATORY_CARE_PROVIDER_SITE_OTHER): Payer: Medicaid Other | Admitting: Certified Nurse Midwife

## 2015-07-25 VITALS — BP 100/48 | HR 88 | Temp 98.1°F | Wt 196.1 lb

## 2015-07-25 DIAGNOSIS — Z3483 Encounter for supervision of other normal pregnancy, third trimester: Secondary | ICD-10-CM

## 2015-07-25 LAB — POCT URINALYSIS DIP (DEVICE)
Glucose, UA: NEGATIVE mg/dL
NITRITE: NEGATIVE
Protein, ur: 30 mg/dL — AB
Specific Gravity, Urine: 1.025 (ref 1.005–1.030)
Urobilinogen, UA: 2 mg/dL — ABNORMAL HIGH (ref 0.0–1.0)
pH: 6.5 (ref 5.0–8.0)

## 2015-07-25 NOTE — Patient Instructions (Signed)

## 2015-07-25 NOTE — Progress Notes (Signed)
Subjective:  Sheila Sosa is a 19 y.o. G3P1011 at 1154w0d being seen today for ongoing prenatal care.  She is currently monitored for the following issues for this low-risk pregnancy and has Subchorionic hemorrhage; Supervision of low-risk pregnancy; Poor weight gain of pregnancy; and Ultrasound recheck of fetal pyelectasis, antepartum on her problem list. Pt [facility] but wants to keep coming here for her prenatal care.  Patient reports no complaints.  Contractions: Irregular. Vag. Bleeding: None.  Movement: Present. Denies leaking of fluid.   The following portions of the patient's history were reviewed and updated as appropriate: allergies, current medications, past family history, past medical history, past social history, past surgical history and problem list. Problem list updated.  Objective:   Filed Vitals:   07/25/15 0907  BP: 100/48  Pulse: 88  Temp: 98.1 F (36.7 C)  Weight: 196 lb 1.6 oz (88.95 kg)    Fetal Status: Fetal Heart Rate (bpm): 129   Movement: Present     General:  Alert, oriented and cooperative. Patient is in no acute distress.  Skin: Skin is warm and dry. No rash noted.   Cardiovascular: Normal heart rate noted  Respiratory: Normal respiratory effort, no problems with respiration noted  Abdomen: Soft, gravid, appropriate for gestational age. Pain/Pressure: Absent     Pelvic: Vag. Bleeding: None     Cervical exam deferred        Extremities: Normal range of motion.  Edema: None  Mental Status: Normal mood and affect. Normal behavior. Normal judgment and thought content.   Urinalysis: Urine Protein: Trace Urine Glucose: Negative  Assessment and Plan:  Pregnancy: G3P1011 at 5354w0d  There are no diagnoses linked to this encounter. Preterm labor symptoms and general obstetric precautions including but not limited to vaginal bleeding, contractions, leaking of fluid and fetal movement were reviewed in detail with  the patient. Please refer to After Visit Summary for other counseling recommendations.  Return in about 1 week (around 08/01/2015).   Rhea PinkLori A Clemmons, CNM

## 2015-07-25 NOTE — Progress Notes (Signed)
Erroneous entry

## 2015-07-31 ENCOUNTER — Ambulatory Visit (INDEPENDENT_AMBULATORY_CARE_PROVIDER_SITE_OTHER): Payer: Medicaid Other | Admitting: Certified Nurse Midwife

## 2015-07-31 ENCOUNTER — Other Ambulatory Visit (HOSPITAL_COMMUNITY)
Admission: RE | Admit: 2015-07-31 | Discharge: 2015-07-31 | Disposition: A | Discharge status: Home / Self Care | Payer: Medicaid Other | Source: Ambulatory Visit | Attending: Certified Nurse Midwife | Admitting: Certified Nurse Midwife

## 2015-07-31 VITALS — BP 106/64 | HR 97 | Temp 98.4°F | Wt 195.0 lb

## 2015-07-31 DIAGNOSIS — Z3483 Encounter for supervision of other normal pregnancy, third trimester: Secondary | ICD-10-CM | POA: Diagnosis not present

## 2015-07-31 DIAGNOSIS — Z113 Encounter for screening for infections with a predominantly sexual mode of transmission: Secondary | ICD-10-CM | POA: Insufficient documentation

## 2015-07-31 DIAGNOSIS — Z3493 Encounter for supervision of normal pregnancy, unspecified, third trimester: Secondary | ICD-10-CM

## 2015-07-31 LAB — POCT URINALYSIS DIP (DEVICE)
Glucose, UA: 100 mg/dL — AB
Ketones, ur: NEGATIVE mg/dL
Nitrite: NEGATIVE
Protein, ur: NEGATIVE mg/dL
Specific Gravity, Urine: 1.025 (ref 1.005–1.030)
Urobilinogen, UA: 4 mg/dL — ABNORMAL HIGH (ref 0.0–1.0)
pH: 6 (ref 5.0–8.0)

## 2015-07-31 NOTE — Patient Instructions (Signed)
Group B streptococcus (GBS) is a type of bacteria often found in healthy women. GBS is not the same as the bacteria that causes strep throat. You may have GBS in your vagina, rectum, or bladder. GBS does not spread through sexual contact, but it can be passed to a baby during childbirth. This can be dangerous for your baby. It is not dangerous to you and usually does not cause any symptoms. Your health care provider may test you for GBS when your pregnancy is between 35 and 37 weeks. GBS is dangerous only during birth, so there is no need to test for it earlier. It is possible to have GBS during pregnancy and never pass it to your baby. If your test results are positive for GBS, your health care provider may recommend giving you antibiotic medicine during delivery to make sure your baby stays healthy. RISK FACTORS You are more likely to pass GBS to your baby if:   Your water breaks (ruptured membrane) or you go into labor before 37 weeks.  Your water breaks 18 hours before you deliver.  You passed GBS during a previous pregnancy.  You have a urinary tract infection caused by GBS any time during pregnancy.  You have a fever during labor. SYMPTOMS Most women who have GBS do not have any symptoms. If you have a urinary tract infection caused by GBS, you might have frequent or painful urination and fever. Babies who get GBS usually show symptoms within 7 days of birth. Symptoms may include:   Breathing problems.  Heart and blood pressure problems.  Digestive and kidney problems. DIAGNOSIS Routine screening for GBS is recommended for all pregnant women. A health care provider takes a sample of the fluid in your vagina and rectum with a swab. It is then sent to a lab to be checked for GBS. A sample of your urine may also be checked for the bacteria.  TREATMENT If you test positive for GBS, you may need treatment with an antibiotic medicine during labor. As soon as you go into labor, or as soon as  your membranes rupture, you will get the antibiotic medicine through an IV access. You will continue to get the medicine until after you give birth. You do not need antibiotic medicine if you are having a cesarean delivery.If your baby shows signs or symptoms of GBS after birth, your baby can also be treated with an antibiotic medicine. HOME CARE INSTRUCTIONS   Take all antibiotic medicine as prescribed by your health care provider. Only take medicine as directed.   Continue with prenatal visits and care.   Keep all follow-up appointments.  SEEK MEDICAL CARE IF:   You have pain when you urinate.   You have to urinate frequently.   You have a fever.  SEEK IMMEDIATE MEDICAL CARE IF:   Your membranes rupture.  You go into labor.   This information is not intended to replace advice given to you by your health care provider. Make sure you discuss any questions you have with your health care provider.   Document Released: 10/28/2007 Document Revised: 07/26/2013 Document Reviewed: 05/13/2013 Elsevier Interactive Patient Education 2016 [employer]  

## 2015-07-31 NOTE — Progress Notes (Signed)
Subjective:  Sheila Sosa is a 19 y.o. G3P1011 at 5640w6d being seen today for ongoing prenatal care.  She is currently monitored for the following issues for this low-risk pregnancy and has Subchorionic hemorrhage; Supervision of low-risk pregnancy; Poor weight gain of pregnancy; and Ultrasound recheck of fetal pyelectasis, antepartum on her problem list.  Patient reports no complaints.  Contractions: Irritability. Vag. Bleeding: None.  Movement: Present. Denies leaking of fluid.   The following portions of the patient's history were reviewed and updated as appropriate: allergies, current medications, past family history, past medical history, past social history, past surgical history and problem list. Problem list updated.  Objective:   Filed Vitals:   07/31/15 1144  BP: 106/64  Pulse: 97  Temp: 98.4 F (36.9 C)  Weight: 195 lb (88.451 kg)    Fetal Status: Fetal Heart Rate (bpm): 158 Fundal Height: 36 cm Movement: Present     General:  Alert, oriented and cooperative. Patient is in no acute distress.  Skin: Skin is warm and dry. No rash noted.   Cardiovascular: Normal heart rate noted  Respiratory: Normal respiratory effort, no problems with respiration noted  Abdomen: Soft, gravid, appropriate for gestational age. Pain/Pressure: Absent     Pelvic: Vag. Bleeding: None     Cervical exam performed Dilation: 1 Effacement (%): Thick Station: -3  Extremities: Normal range of motion.  Edema: None  Mental Status: Normal mood and affect. Normal behavior. Normal judgment and thought content.   Urinalysis: Urine Protein: Trace Urine Glucose: 1+  Assessment and Plan:  Pregnancy: G3P1011 at 4640w6d  1. Supervision of low-risk pregnancy, third trimester  - Culture, OB Urine - Culture, beta strep (group b only) - GC/Chlamydia probe amp ([place]not at [facility] - Wet prep, genital  Term labor symptoms and general obstetric precautions including but not limited to vaginal bleeding,  contractions, leaking of fluid and fetal movement were reviewed in detail with the patient. Please refer to After Visit Summary for other counseling recommendations.  Return in about 1 week (around 08/07/2015).   Rhea PinkLori A Deysy Schabel, CNM

## 2015-08-01 LAB — CULTURE, OB URINE: Colony Count: 25000

## 2015-08-01 LAB — WET PREP, GENITAL
Trich, Wet Prep: NONE SEEN
Yeast Wet Prep HPF POC: NONE SEEN

## 2015-08-02 LAB — CULTURE, BETA STREP (GROUP B ONLY)

## 2015-08-04 LAB — GC/CHLAMYDIA PROBE AMP (~~LOC~~) NOT AT ARMC
Chlamydia: NEGATIVE
Neisseria Gonorrhea: NEGATIVE

## 2015-08-07 ENCOUNTER — Ambulatory Visit (INDEPENDENT_AMBULATORY_CARE_PROVIDER_SITE_OTHER): Payer: Medicaid Other | Admitting: Advanced Practice Midwife

## 2015-08-07 VITALS — BP 113/63 | HR 80 | Temp 98.6°F | Wt 195.1 lb

## 2015-08-07 DIAGNOSIS — Z3493 Encounter for supervision of normal pregnancy, unspecified, third trimester: Secondary | ICD-10-CM | POA: Diagnosis not present

## 2015-08-07 LAB — POCT URINALYSIS DIP (DEVICE)
Bilirubin Urine: NEGATIVE
Glucose, UA: NEGATIVE mg/dL
HGB URINE DIPSTICK: NEGATIVE
Ketones, ur: NEGATIVE mg/dL
NITRITE: NEGATIVE
PH: 7.5 (ref 5.0–8.0)
PROTEIN: NEGATIVE mg/dL
Specific Gravity, Urine: 1.015 (ref 1.005–1.030)
UROBILINOGEN UA: 1 mg/dL (ref 0.0–1.0)

## 2015-08-07 NOTE — Progress Notes (Signed)
Subjective:  Sheila Sosa is a 20 y.o. G3P1011 at 5876w6d being seen today for ongoing prenatal care.  She is currently monitored for the following issues for this low-risk pregnancy and has Supervision of low-risk pregnancy and Poor weight gain of pregnancy on her problem list.  Patient reports no complaints.  Contractions: Irritability. Vag. Bleeding: None.  Movement: Present. Denies leaking of fluid.   The following portions of the patient's history were reviewed and updated as appropriate: allergies, current medications, past family history, past medical history, past social history, past surgical history and problem list. Problem list updated.  Objective:   Filed Vitals:   08/07/15 1246  BP: 113/63  Pulse: 80  Temp: 98.6 F (37 C)  Weight: 195 lb 1.6 oz (88.497 kg)    Fetal Status: Fetal Heart Rate (bpm): 159 Fundal Height: 37 cm Movement: Present     General:  Alert, oriented and cooperative. Patient is in no acute distress.  Skin: Skin is warm and dry. No rash noted.   Cardiovascular: Normal heart rate noted  Respiratory: Normal respiratory effort, no problems with respiration noted  Abdomen: Soft, gravid, appropriate for gestational age. Pain/Pressure: Present     Pelvic: Vag. Bleeding: None     Cervical exam deferred        Extremities: Normal range of motion.  Edema: None  Mental Status: Normal mood and affect. Normal behavior. Normal judgment and thought content.   Urinalysis: Urine Protein: Negative Urine Glucose: Negative  Assessment and Plan:  Pregnancy: G3P1011 at 5576w6d  1. Supervision of low-risk pregnancy, third trimester   Term labor symptoms and general obstetric precautions including but not limited to vaginal bleeding, contractions, leaking of fluid and fetal movement were reviewed in detail with the patient. Please refer to After Visit Summary for other counseling recommendations.  No Follow-up on file.   Hurshel PartyLisa A Leftwich-Kirby, CNM

## 2015-08-14 ENCOUNTER — Encounter: Payer: Medicaid Other | Admitting: Student

## 2015-08-21 ENCOUNTER — Encounter: Payer: Medicaid Other | Admitting: Advanced Practice Midwife

## 2015-08-28 ENCOUNTER — Encounter: Payer: Medicaid Other | Admitting: Family

## 2015-08-28 ENCOUNTER — Encounter: Payer: Self-pay | Admitting: Family

## 2015-08-28 ENCOUNTER — Telehealth: Payer: Self-pay | Admitting: Family

## 2015-08-28 NOTE — Telephone Encounter (Signed)
Called patient about missed appointment, but her phone is not in service. Sending certified letter.

## 2015-10-31 ENCOUNTER — Encounter (HOSPITAL_COMMUNITY): Payer: Self-pay | Admitting: *Deleted

## 2016-02-13 IMAGING — US US OB TRANSVAGINAL
1 series · 14 of 22 positions shown · non-contrast
Comparison: None.

CLINICAL DATA: Assess viability

EXAM:
TRANSVAGINAL OB ULTRASOUND
TECHNIQUE: Transvaginal ultrasound was performed for complete evaluation of the
gestation as well as the maternal uterus, adnexal regions, and
pelvic cul-de-sac.

[Series 1: us ob transvaginal · 14 of 22 slices shown]
[im 1/22]
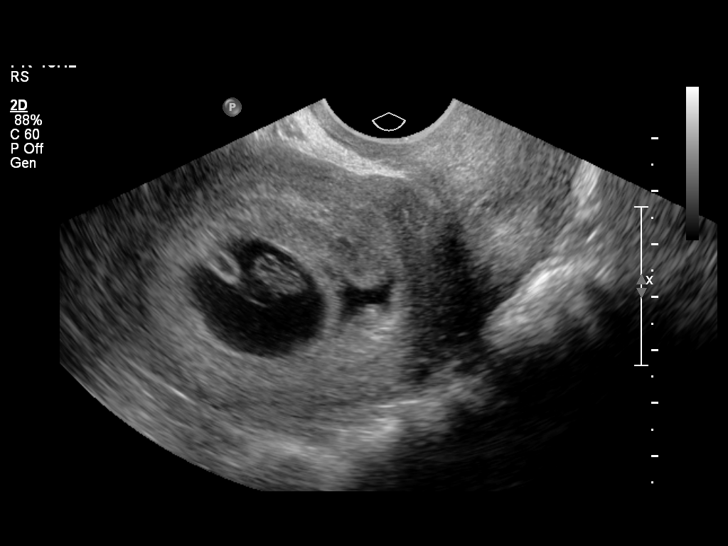
[im 3/22]
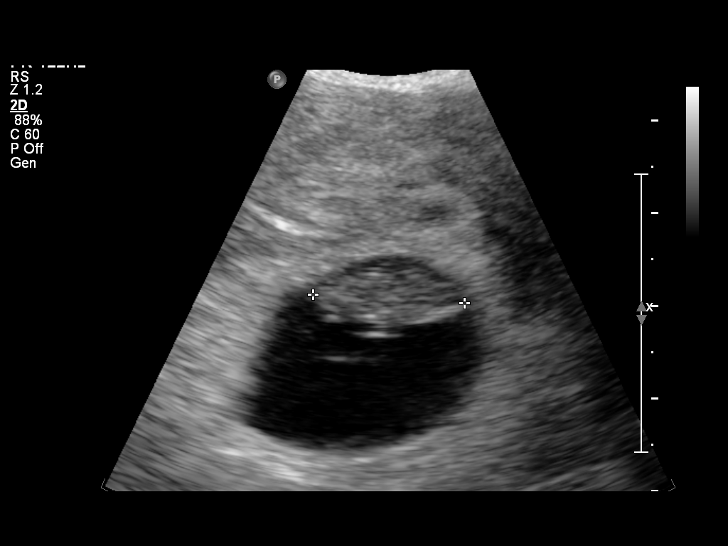
[im 4/22]
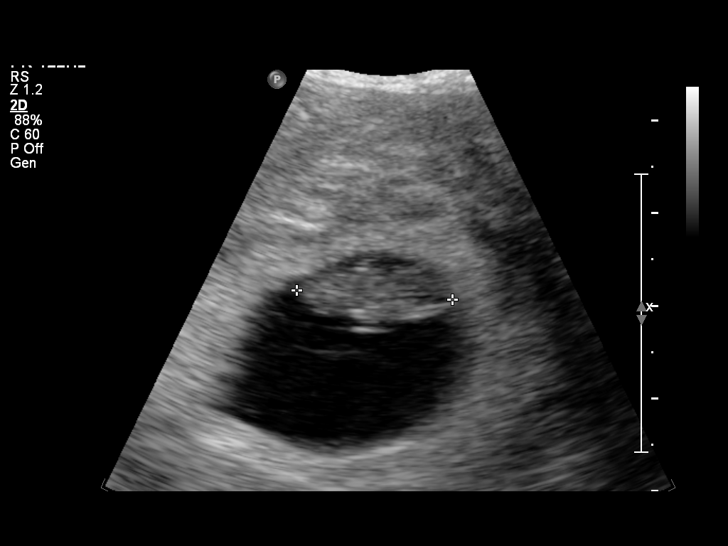
[im 6/22]
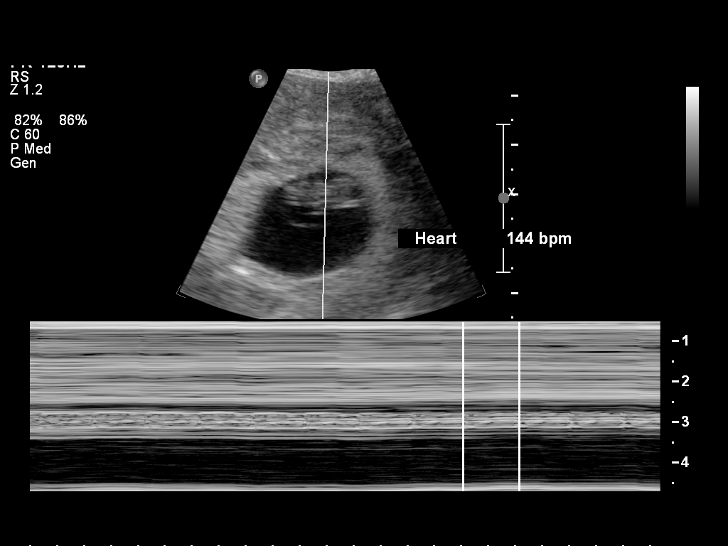
[im 8/22]
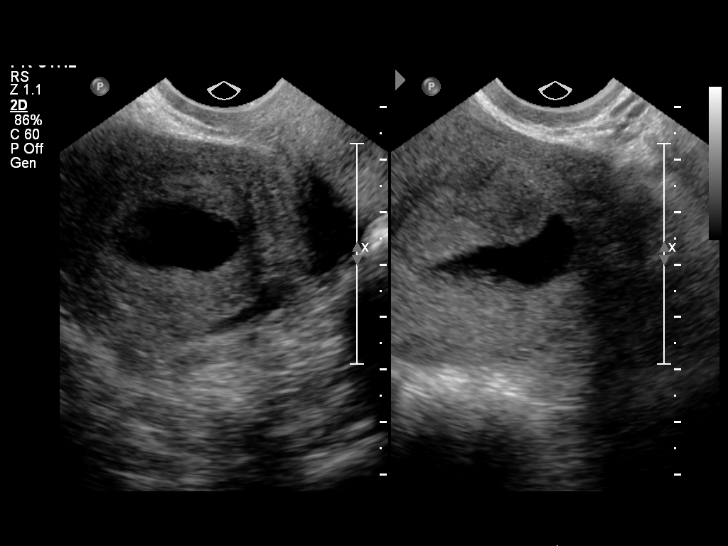
[im 9/22]
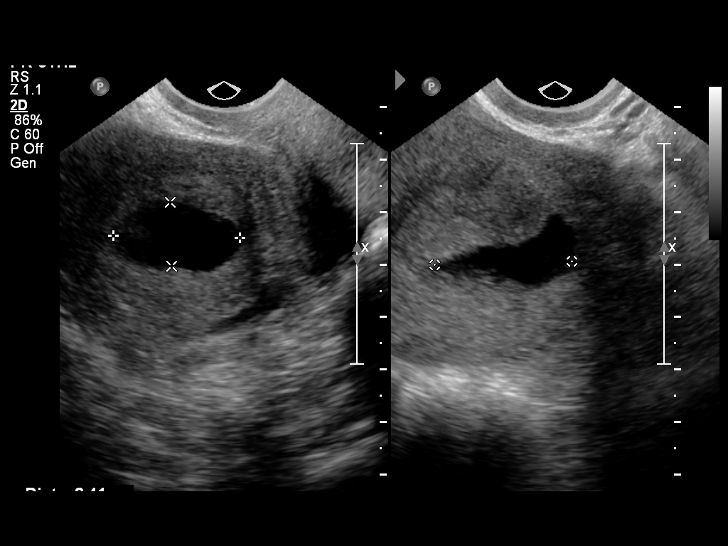
[im 11/22]
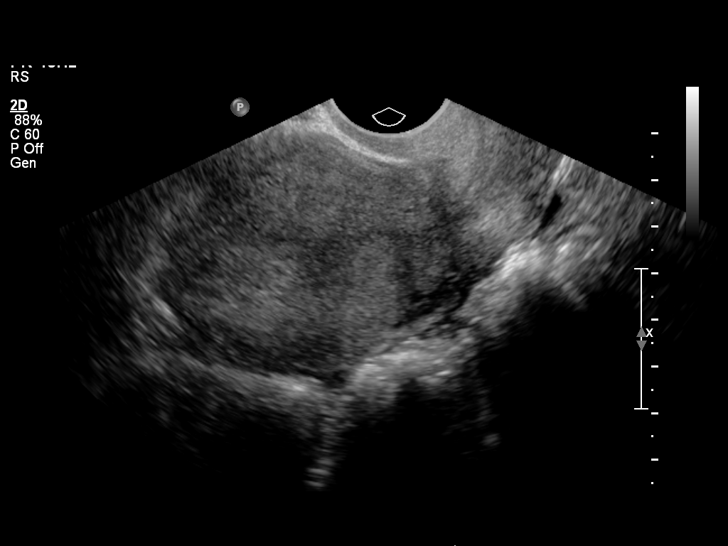
[im 12/22]
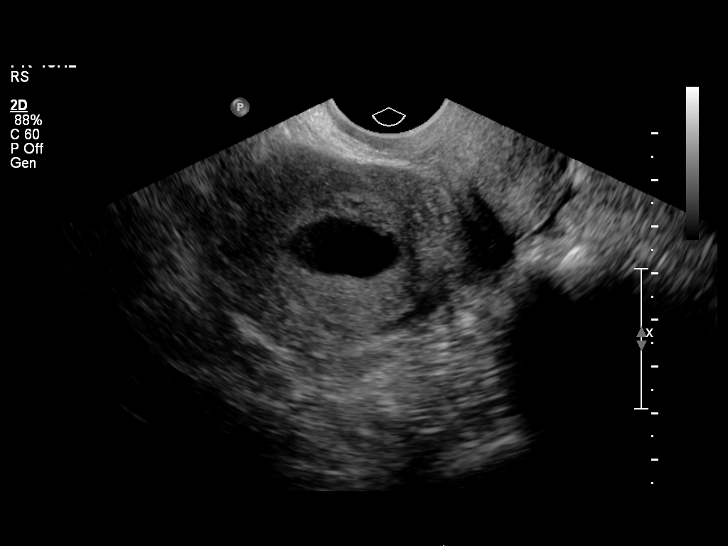
[im 14/22]
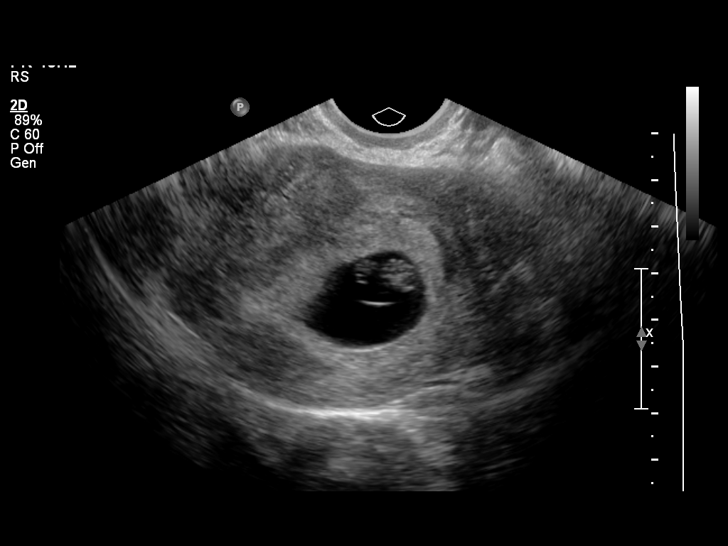
[im 15/22]
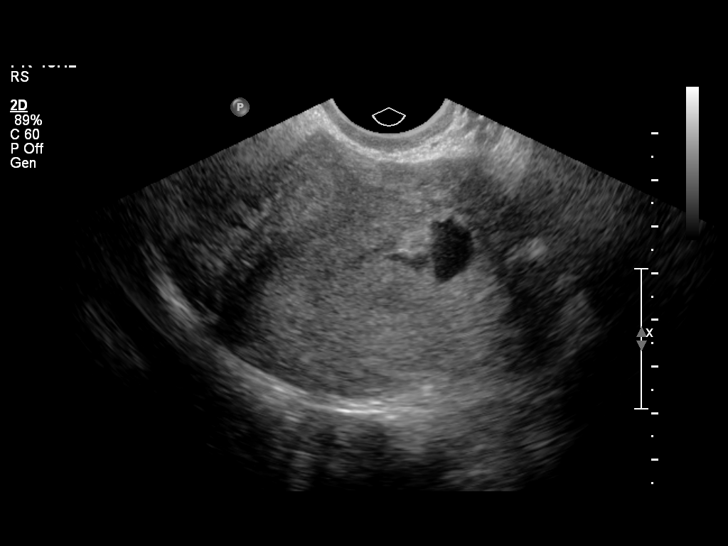
[im 17/22]
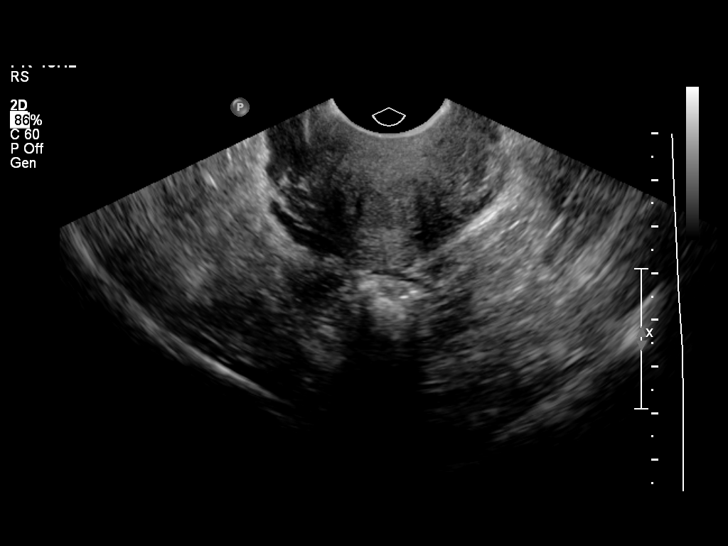
[im 19/22]
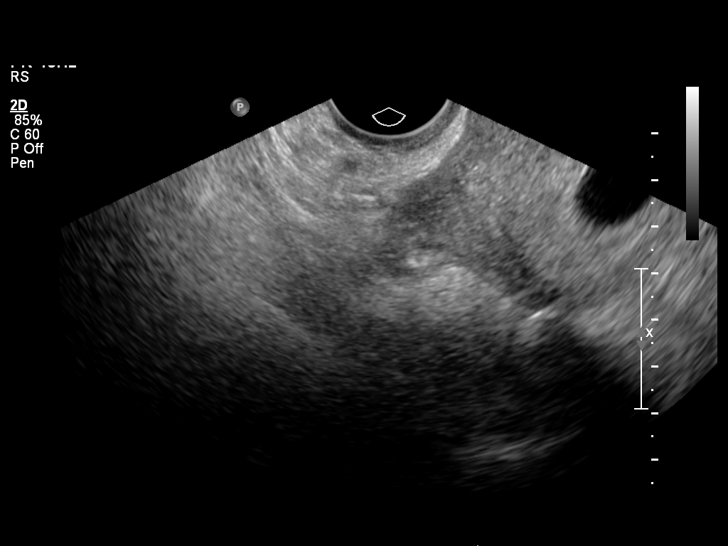
[im 20/22]
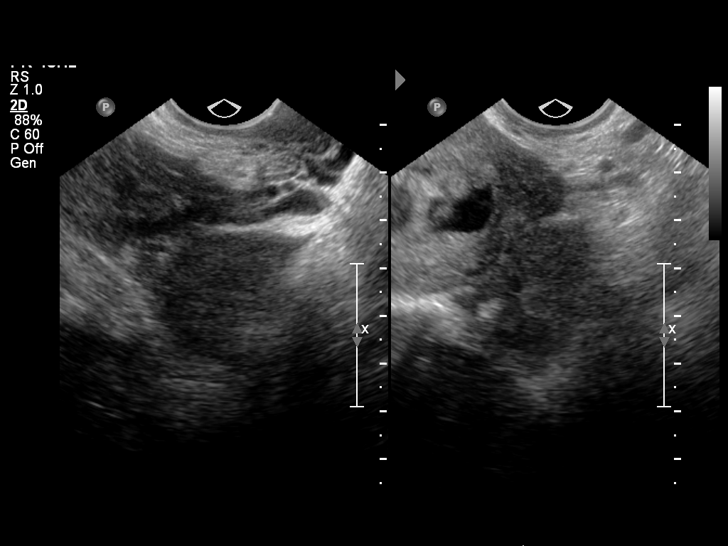
[im 22/22]
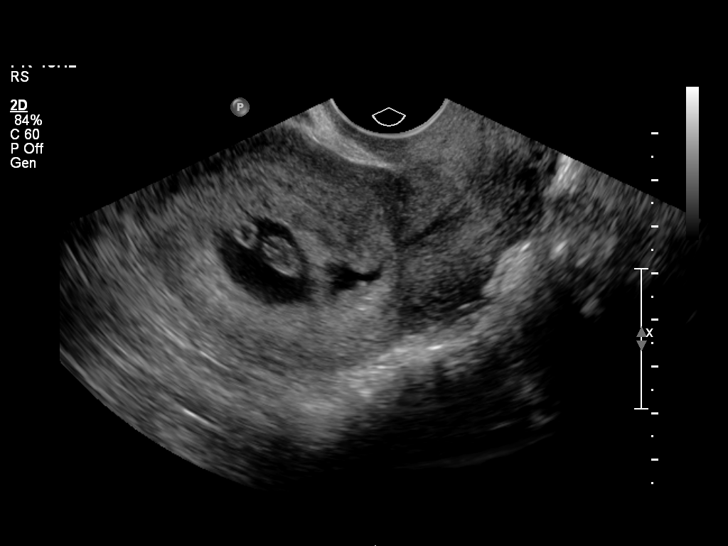

[14 of 22 positions shown; findings below may reference images not displayed]

FINDINGS: Intrauterine gestational sac: Single

Yolk sac:  Yes

Embryo:  Yes

Cardiac Activity: Yes

Heart Rate: 144 bpm

CRL:   16.8  mm   8 w 1 d                  US EDC: 08/21/2015

Maternal uterus/adnexae:

Subchorionic hemorrhage: Moderate subchorionic hemorrhage measuring
2.4 x 1.2 x 2.6 cm noted.

Right ovary: Not visualize

Left ovary: Normal

Other :None

Free fluid:  None
IMPRESSION: 1. Single living intrauterine gestation. The estimated gestational
age is 8 weeks and 1 day. The clinical gestational age is 7 weeks
and 0 days
2. Moderate subchorionic hemorrhage.

## 2016-05-20 IMAGING — US US MFM OB COMP +14 WKS
2 series · 14 of 28 positions shown · non-contrast
Comparison: none

[Series 1: us mfm ob comp +14 wks · 73 acquisitions, 13 frames shown (1 of 2)]
[im 3/73]
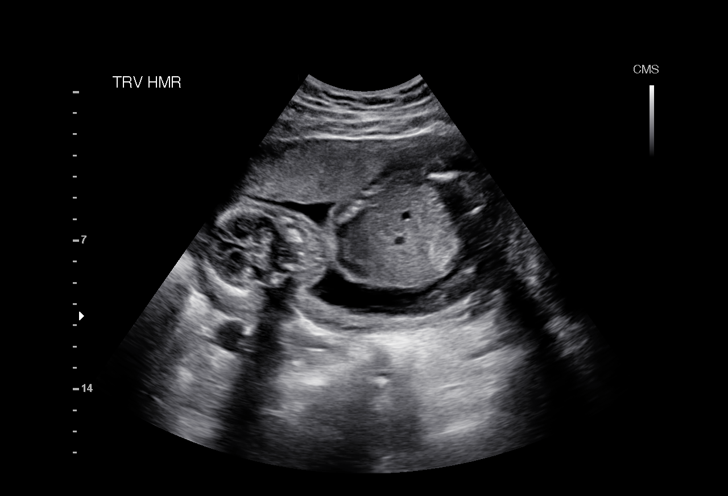
[im 9/73]
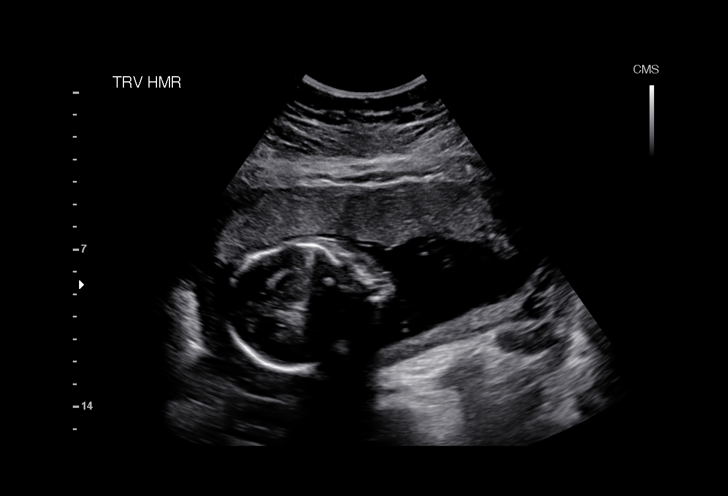
[im 14/73]
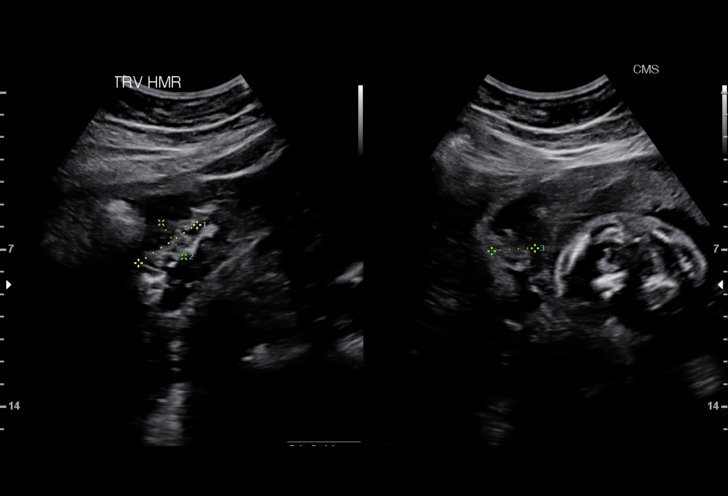
[im 20/73]
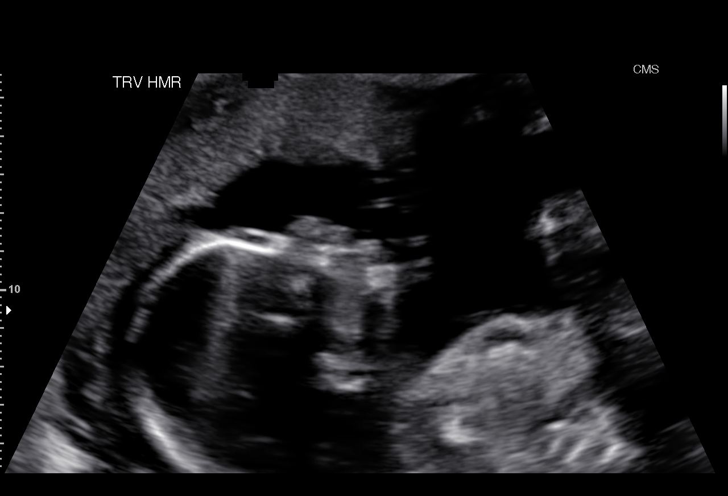
[im 25/73]
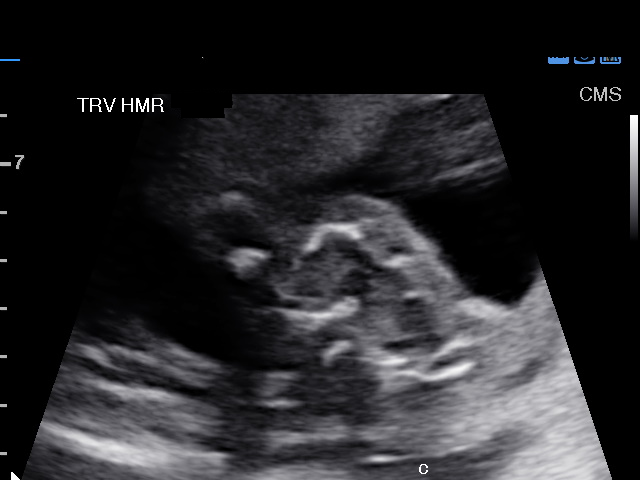
[im 31/73]
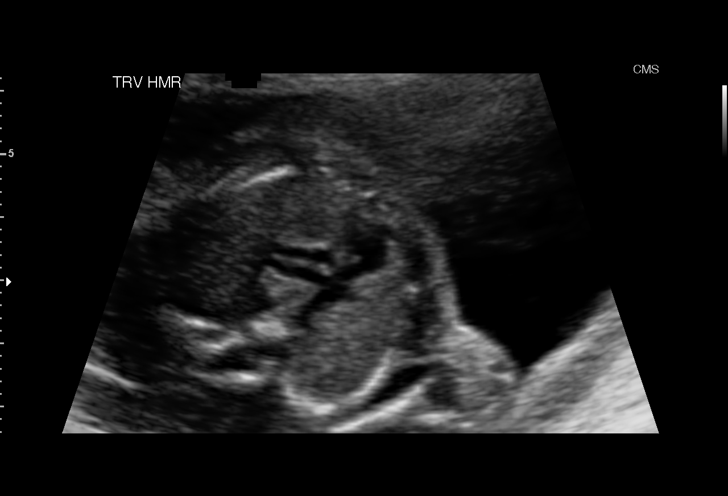
[im 37/73]
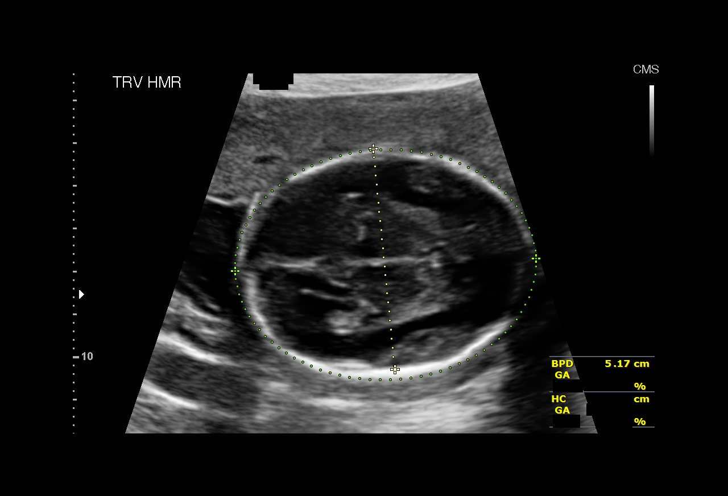
[im 42/73]
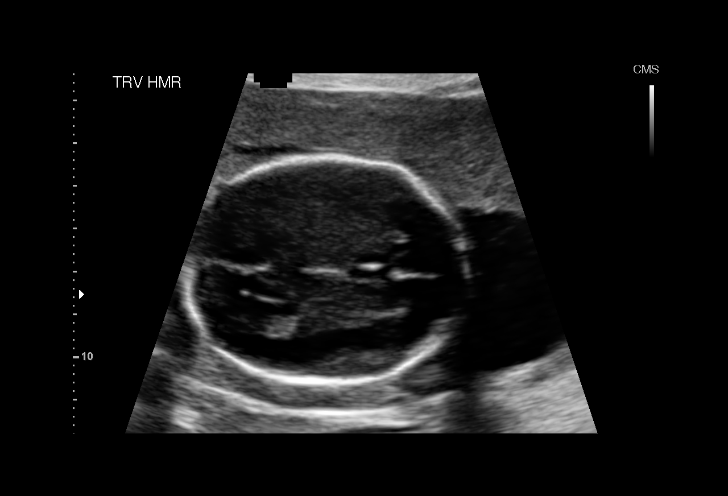
[im 48/73]
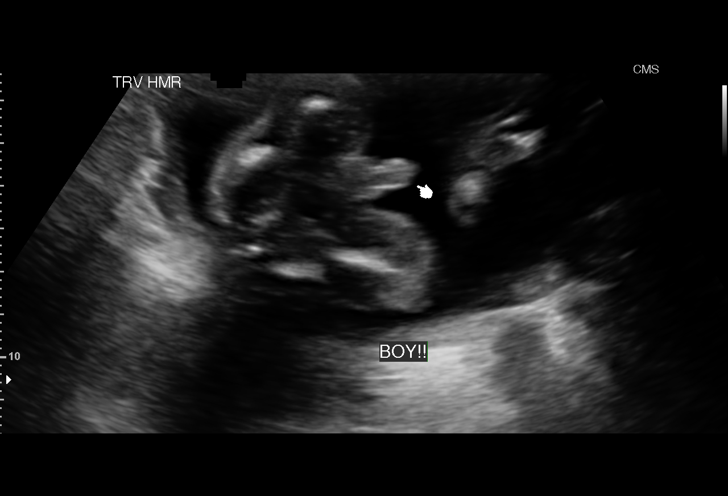
[im 53/73]
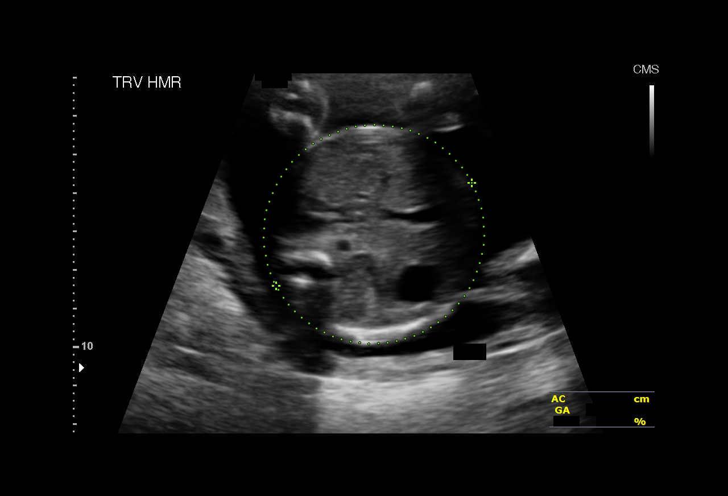
[im 59/73]
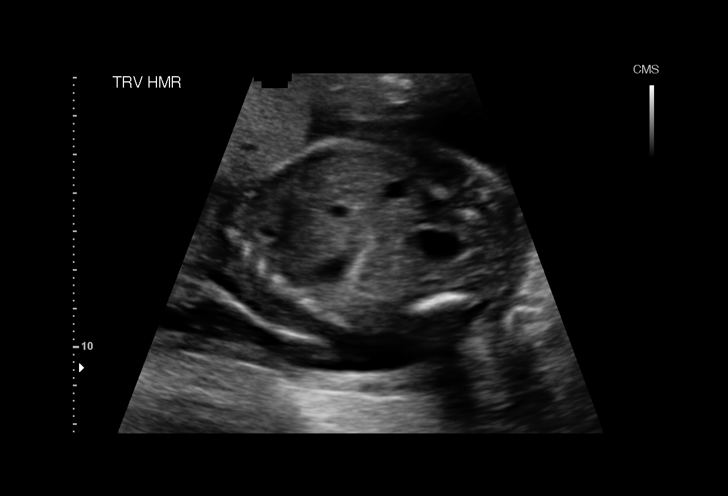
[im 64/73]
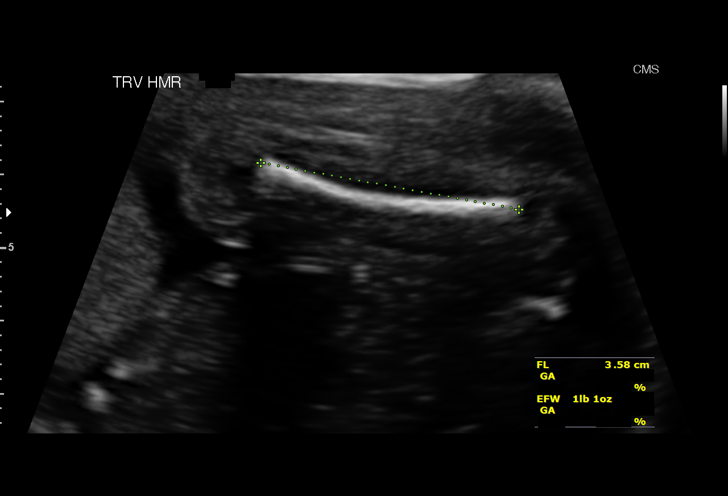
[im 70/73]
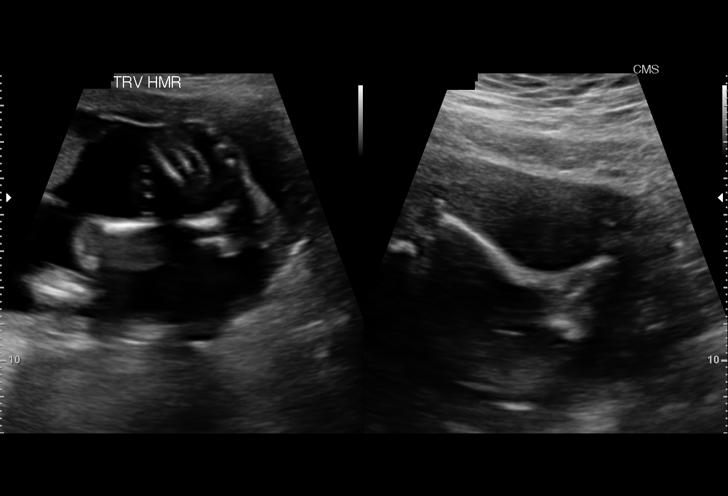

[Series 3: us mfm ob comp +14 wks · 1 of 1 slices shown (2 of 2)]
[im 1/1]
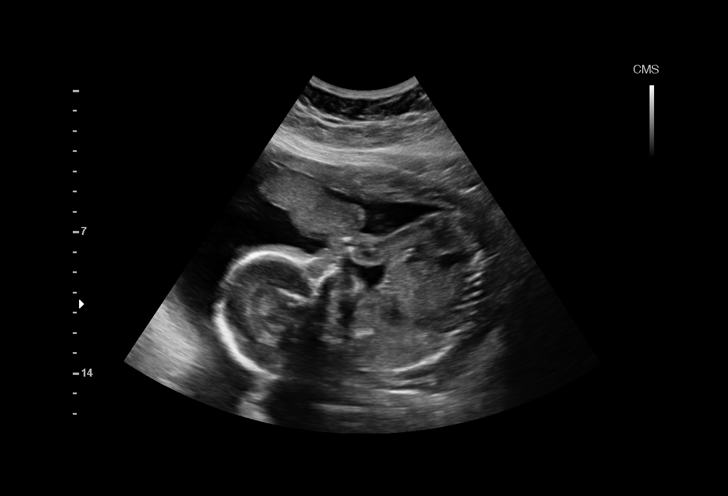

[14 of 28 positions shown; findings below may reference images not displayed]

OBSTETRICS REPORT
(Signed Final 04/17/2015 [DATE])

Faculty Physician
Service(s) Provided

Indications

22 weeks gestation of pregnancy
Basic anatomic survey                                 Z36
Fetal Evaluation

Num Of Fetuses:    1
Fetal Heart Rate:  157                          bpm
Cardiac Activity:  Observed
Presentation:      Variable
Placenta:          Anterior, above cervical os
P. Cord            Visualized, central
Insertion:

Amniotic Fluid
AFI FV:      Subjectively within normal limits
Larg Pckt:    4.64  cm
Biometry

BPD:     51.9  mm     G. Age:  21w 5d                CI:        70.78   70 - 86
FL/HC:      18.1   18.4 -
20.2
HC:     196.6  mm     G. Age:  21w 6d       33  %    HC/AC:      1.10   1.06 -
1.25
AC:     179.4  mm     G. Age:  22w 6d       68  %    FL/BPD:     68.4   71 - 87
FL:      35.5  mm     G. Age:  21w 1d       18  %    FL/AC:      19.8   20 - 24
HUM:     35.1  mm     G. Age:  22w 0d       51  %
CER:     22.9  mm     G. Age:  21w 3d       34  %

Est. FW:     473  gm      1 lb 1 oz     48  %
Gestational Age

LMP:           20w 6d        Date:  11/22/14                 EDD:   08/29/15
U/S Today:     21w 6d                                        EDD:   08/22/15
Best:          22w 0d     Det. By:  Early Ultrasound         EDD:   08/21/15
(01/10/15)
Anatomy
Cranium:          Appears normal         Aortic Arch:      Appears normal
Fetal Cavum:      Appears normal         Ductal Arch:      Appears normal
Ventricles:       Appears normal         Diaphragm:        Appears normal
Choroid Plexus:   Appears normal         Stomach:          Appears normal, left
sided
Cerebellum:       Appears normal         Abdomen:          Appears normal
Posterior Fossa:  Appears normal         Abdominal Wall:   Appears nml (cord
insert, abd wall)
Nuchal Fold:      Appears normal         Cord Vessels:     Appears normal (3
vessel cord)
Face:             Appears normal         Kidneys:          Bilat pyelectasis
(orbits and profile)
Rt 6.3 mm Lt
mm
Lips:             Appears normal         Bladder:          Appears normal
Heart:            Appears normal         Spine:            Not well visualized
(4CH, axis, and
situs)
RVOT:             Appears normal         Lower             Appears normal
Extremities:
LVOT:             Appears normal         Upper             Appears normal
Extremities:

Other:  Fetus appears to be a male. Heels and left 5th digit visualized. Nasal
bone visualized. Technically difficult due to fetal position.
Targeted Anatomy

Fetal Central Nervous System
Cisterna Magna:
Cervix Uterus Adnexa

Cervical Length:    4.4      cm

Cervix:       Normal appearance by transabdominal scan.
Uterus:       No abnormality visualized.
Cul De Sac:   No free fluid seen.

Left Ovary:    Within normal limits.
Right Ovary:   Within normal limits.
Adnexa:     No abnormality visualized.
Impression

Single IUP at 22w 0d
Mild, bilateral urinary tract dilation is noted
The left renal pelvis measures 6.3 mm; the right renal pelvis
measures 6.5 mm.  No calyceal dilation is noted
The remainder of the fetal anatomy appears normal
The estimated fetal weight is at the 48th %tile
Anterior placenta without previa
Normal amniotic fluid volume
Recommendations

Recommend follow-up ultrasound examination in 8 weeks to
reevaluate the fetal kidneys

## 2019-01-19 ENCOUNTER — Encounter: Payer: Self-pay | Admitting: *Deleted
